# Patient Record
Sex: Female | Born: 1955 | Race: Black or African American | Hispanic: No | Smoking: Current every day smoker
Health system: Southern US, Community
[De-identification: ages and names within clinical notes are randomized; demographics above are authoritative.]

## PROBLEM LIST (undated history)

## (undated) DIAGNOSIS — R3129 Other microscopic hematuria: Secondary | ICD-10-CM

## (undated) DIAGNOSIS — K219 Gastro-esophageal reflux disease without esophagitis: Secondary | ICD-10-CM

## (undated) DIAGNOSIS — N809 Endometriosis, unspecified: Secondary | ICD-10-CM

## (undated) DIAGNOSIS — N951 Menopausal and female climacteric states: Secondary | ICD-10-CM

## (undated) DIAGNOSIS — I10 Essential (primary) hypertension: Secondary | ICD-10-CM

## (undated) DIAGNOSIS — R7989 Other specified abnormal findings of blood chemistry: Secondary | ICD-10-CM

## (undated) HISTORY — PX: ABDOMINAL HYSTERECTOMY: SHX81

## (undated) HISTORY — PX: TUBAL LIGATION: SHX77

## (undated) HISTORY — DX: Essential (primary) hypertension: I10

## (undated) HISTORY — DX: Gastro-esophageal reflux disease without esophagitis: K21.9

## (undated) HISTORY — PX: CHOLECYSTECTOMY: SHX55

## (undated) HISTORY — PX: OOPHORECTOMY: SHX86

---

## 2005-05-22 ENCOUNTER — Ambulatory Visit: Payer: Self-pay | Admitting: Internal Medicine

## 2005-10-18 ENCOUNTER — Ambulatory Visit: Payer: Self-pay | Admitting: Unknown Physician Specialty

## 2007-06-11 ENCOUNTER — Other Ambulatory Visit: Payer: Self-pay

## 2007-06-11 ENCOUNTER — Emergency Department: Payer: Self-pay | Admitting: Emergency Medicine

## 2007-06-17 ENCOUNTER — Ambulatory Visit: Payer: Self-pay | Admitting: General Surgery

## 2007-08-15 ENCOUNTER — Ambulatory Visit: Payer: Self-pay | Admitting: Internal Medicine

## 2008-05-18 ENCOUNTER — Ambulatory Visit: Payer: Self-pay | Admitting: Internal Medicine

## 2008-08-13 ENCOUNTER — Ambulatory Visit: Payer: Self-pay | Admitting: Urology

## 2008-08-13 ENCOUNTER — Ambulatory Visit: Payer: Self-pay | Admitting: Unknown Physician Specialty

## 2008-08-26 ENCOUNTER — Ambulatory Visit: Payer: Self-pay | Admitting: Unknown Physician Specialty

## 2009-12-24 ENCOUNTER — Ambulatory Visit: Payer: Self-pay | Admitting: Internal Medicine

## 2011-03-01 ENCOUNTER — Ambulatory Visit: Payer: Self-pay | Admitting: Internal Medicine

## 2011-12-16 ENCOUNTER — Emergency Department: Payer: Self-pay | Admitting: Emergency Medicine

## 2011-12-16 LAB — URINALYSIS, COMPLETE
Bilirubin,UR: NEGATIVE
Glucose,UR: NEGATIVE mg/dL (ref 0–75)
Ph: 6 (ref 4.5–8.0)
Protein: NEGATIVE
Specific Gravity: 1.012 (ref 1.003–1.030)
WBC UR: 3 /HPF (ref 0–5)

## 2011-12-16 LAB — CBC
HCT: 42.3 % (ref 35.0–47.0)
HGB: 14.1 g/dL (ref 12.0–16.0)
MCV: 96 fL (ref 80–100)
Platelet: 256 10*3/uL (ref 150–440)
RBC: 4.39 10*6/uL (ref 3.80–5.20)
RDW: 13.7 % (ref 11.5–14.5)
WBC: 10.2 10*3/uL (ref 3.6–11.0)

## 2011-12-16 LAB — COMPREHENSIVE METABOLIC PANEL
Anion Gap: 9 (ref 7–16)
Chloride: 103 mmol/L (ref 98–107)
Co2: 30 mmol/L (ref 21–32)
EGFR (African American): 59 — ABNORMAL LOW
EGFR (Non-African Amer.): 49 — ABNORMAL LOW
Osmolality: 286 (ref 275–301)
SGOT(AST): 25 U/L (ref 15–37)
SGPT (ALT): 21 U/L
Total Protein: 7.8 g/dL (ref 6.4–8.2)

## 2011-12-28 ENCOUNTER — Ambulatory Visit: Payer: Self-pay | Admitting: Internal Medicine

## 2012-03-05 ENCOUNTER — Ambulatory Visit: Payer: Self-pay | Admitting: Internal Medicine

## 2013-05-29 ENCOUNTER — Ambulatory Visit: Payer: Self-pay | Admitting: Internal Medicine

## 2014-06-01 ENCOUNTER — Ambulatory Visit: Payer: Self-pay | Admitting: Internal Medicine

## 2015-02-24 DIAGNOSIS — R7989 Other specified abnormal findings of blood chemistry: Secondary | ICD-10-CM | POA: Insufficient documentation

## 2015-05-11 ENCOUNTER — Other Ambulatory Visit: Payer: Self-pay | Admitting: Internal Medicine

## 2015-05-11 DIAGNOSIS — Z1231 Encounter for screening mammogram for malignant neoplasm of breast: Secondary | ICD-10-CM

## 2015-06-03 ENCOUNTER — Ambulatory Visit
Admission: RE | Admit: 2015-06-03 | Discharge: 2015-06-03 | Disposition: A | Discharge status: Home / Self Care | Payer: Federal, State, Local not specified - PPO | Source: Ambulatory Visit | Attending: Internal Medicine | Admitting: Internal Medicine

## 2015-06-03 DIAGNOSIS — Z1231 Encounter for screening mammogram for malignant neoplasm of breast: Secondary | ICD-10-CM | POA: Diagnosis present

## 2017-03-02 ENCOUNTER — Ambulatory Visit (INDEPENDENT_AMBULATORY_CARE_PROVIDER_SITE_OTHER): Payer: Federal, State, Local not specified - PPO | Admitting: Urology

## 2017-03-02 ENCOUNTER — Encounter: Payer: Self-pay | Admitting: Urology

## 2017-03-02 VITALS — BP 127/84 | HR 81 | Ht 67.0 in | Wt 196.2 lb

## 2017-03-02 DIAGNOSIS — R3121 Asymptomatic microscopic hematuria: Secondary | ICD-10-CM

## 2017-03-02 LAB — URINALYSIS, COMPLETE
BILIRUBIN UA: NEGATIVE
Glucose, UA: NEGATIVE
KETONES UA: NEGATIVE
LEUKOCYTES UA: NEGATIVE
NITRITE UA: NEGATIVE
SPEC GRAV UA: 1.02 (ref 1.005–1.030)
Urobilinogen, Ur: 0.2 mg/dL (ref 0.2–1.0)
pH, UA: 5.5 (ref 5.0–7.5)

## 2017-03-02 LAB — MICROSCOPIC EXAMINATION

## 2017-03-02 NOTE — Progress Notes (Signed)
03/02/2017 2:49 PM   Rebecca Richardson 1956-01-01 [id number]  Referring provider: Tracie Harrier, MD [address] [facility] [place], [place] [address]  Chief Complaint  Patient presents with  . Hematuria    HPI: The patient is a 61 year old female who presents today for evaluation of microscopic hematuria. The patient notes no history of gross hematuria. She has been told that she has microscopic hematuria on multiple occasions. She does again today in our office with 3-10 red blood cells per high-power field. She gets urinary traction's once or twice per year. She has no UTI symptoms currently. She voids well. She denies incontinence. She has no personal history of nephrolithiasis.  The patient has a 42 year smoking history though she is down to 2 cigarettes per day now.     PMH: Past Medical History:  Diagnosis Date  . GERD (gastroesophageal reflux disease)   . Hypertension     Surgical History: Past Surgical History:  Procedure Laterality Date  . ABDOMINAL HYSTERECTOMY    . CESAREAN SECTION    . CHOLECYSTECTOMY      Home Medications:  Allergies as of 03/02/2017      Reactions   Sulfa Antibiotics Other (See Comments)      Medication List       Accurate as of 03/02/17  2:49 PM. Always use your most recent med list.          meloxicam 15 MG tablet Commonly known as:  MOBIC   triamterene-hydrochlorothiazide 37.5-25 MG capsule Commonly known as:  DYAZIDE   Vitamin D (Ergocalciferol) 50000 units Caps capsule Commonly known as:  DRISDOL       Allergies:  Allergies  Allergen Reactions  . Sulfa Antibiotics Other (See Comments)    Family History: Family History  Problem Relation Age of Onset  . Breast cancer Maternal Aunt 60  . Breast cancer Paternal Grandmother 54  . Prostate cancer Maternal Uncle   . Bladder Cancer Neg Hx   . Kidney cancer Neg Hx     Social History:  reports that she has been smoking.  She has been smoking about  0.25 packs per day. She has never used smokeless tobacco. She reports that she does not drink alcohol or use drugs.  ROS: UROLOGY Frequent Urination?: No Hard to postpone urination?: No Burning/pain with urination?: No Get up at night to urinate?: Yes Leakage of urine?: No Urine stream starts and stops?: No Trouble starting stream?: No Do you have to strain to urinate?: No Blood in urine?: No Urinary tract infection?: Yes Sexually transmitted disease?: No Injury to kidneys or bladder?: No Painful intercourse?: No Weak stream?: No Currently pregnant?: No Vaginal bleeding?: No Last menstrual period?: n  Gastrointestinal Nausea?: No Vomiting?: No Indigestion/heartburn?: Yes Diarrhea?: No Constipation?: No  Constitutional Fever: No Night sweats?: No Weight loss?: No Fatigue?: No  Skin Skin rash/lesions?: No Itching?: Yes  Eyes Blurred vision?: Yes Double vision?: No  Ears/Nose/Throat Sore throat?: No Sinus problems?: No  Hematologic/Lymphatic Swollen glands?: No Easy bruising?: No  Cardiovascular Leg swelling?: No Chest pain?: No  Respiratory Cough?: No Shortness of breath?: No  Endocrine Excessive thirst?: No  Musculoskeletal Back pain?: No Joint pain?: No  Neurological Headaches?: Yes Dizziness?: No  Psychologic Depression?: No Anxiety?: No  Physical Exam: BP 127/84 (BP Location: Left Arm, Patient Position: Sitting, Cuff Size: Normal)   Pulse 81   Ht 5\' 7"  (1.702 m)   Wt 196 lb 3.2 oz (89 kg)   BMI 30.73  kg/m   Constitutional:  Alert and oriented, No acute distress. HEENT: [place] AT, moist mucus membranes.  Trachea midline, no masses. Cardiovascular: No clubbing, cyanosis, or edema. Respiratory: Normal respiratory effort, no increased work of breathing. GI: Abdomen is soft, nontender, nondistended, no abdominal masses GU: No CVA tenderness.  Skin: No rashes, bruises or suspicious lesions. Lymph: No cervical or inguinal  adenopathy. Neurologic: Grossly intact, no focal deficits, moving all 4 extremities. Psychiatric: Normal mood and affect.  Laboratory Data: Lab Results  Component Value Date   WBC 10.2 12/16/2011   HGB 14.1 12/16/2011   HCT 42.3 12/16/2011   MCV 96 12/16/2011   PLT 256 12/16/2011    Lab Results  Component Value Date   CREATININE 1.21 12/16/2011    No results found for: PSA  No results found for: TESTOSTERONE  No results found for: HGBA1C  Urinalysis    Component Value Date/Time   COLORURINE Yellow 12/16/2011 0517   APPEARANCEUR Cloudy 12/16/2011 0517   LABSPEC 1.012 12/16/2011 0517   PHURINE 6.0 12/16/2011 0517   GLUCOSEU Negative 12/16/2011 0517   HGBUR 1+ 12/16/2011 0517   BILIRUBINUR Negative 12/16/2011 0517   KETONESUR Negative 12/16/2011 0517   PROTEINUR Negative 12/16/2011 0517   NITRITE Positive 12/16/2011 0517   LEUKOCYTESUR Negative 12/16/2011 0517     Assessment & Plan:    1. Microscopic hematuria -CT Urogram -follow up for office cystoscopy after above  Return for after CT for cystoscopy.  Nickie Retort, MD  [facility] [address], [place], [place] [address] [phone]

## 2017-03-02 NOTE — Progress Notes (Signed)
Hematuria

## 2017-03-05 ENCOUNTER — Other Ambulatory Visit: Payer: Self-pay | Admitting: Internal Medicine

## 2017-03-05 DIAGNOSIS — Z1231 Encounter for screening mammogram for malignant neoplasm of breast: Secondary | ICD-10-CM

## 2017-03-05 DIAGNOSIS — Z1239 Encounter for other screening for malignant neoplasm of breast: Secondary | ICD-10-CM

## 2017-03-22 ENCOUNTER — Ambulatory Visit
Admission: RE | Admit: 2017-03-22 | Discharge: 2017-03-22 | Disposition: A | Discharge status: Home / Self Care | Payer: Federal, State, Local not specified - PPO | Source: Ambulatory Visit | Attending: Urology | Admitting: Urology

## 2017-03-22 DIAGNOSIS — R3121 Asymptomatic microscopic hematuria: Secondary | ICD-10-CM

## 2017-03-22 DIAGNOSIS — I700 Atherosclerosis of aorta: Secondary | ICD-10-CM | POA: Insufficient documentation

## 2017-03-22 MED ORDER — IOPAMIDOL (ISOVUE-300) INJECTION 61%
125.0000 mL | Freq: Once | INTRAVENOUS | Status: AC | PRN
  Administered 2017-03-22: 125 mL via INTRAVENOUS

## 2017-03-29 ENCOUNTER — Ambulatory Visit: Payer: Federal, State, Local not specified - PPO | Admitting: Urology

## 2017-03-29 ENCOUNTER — Encounter: Payer: Self-pay | Admitting: Urology

## 2017-03-29 VITALS — BP 131/84 | HR 79 | Ht 67.0 in | Wt 196.4 lb

## 2017-03-29 DIAGNOSIS — R3121 Asymptomatic microscopic hematuria: Secondary | ICD-10-CM

## 2017-03-29 LAB — URINALYSIS, COMPLETE
Bilirubin, UA: NEGATIVE
Glucose, UA: NEGATIVE
Ketones, UA: NEGATIVE
LEUKOCYTES UA: NEGATIVE
Nitrite, UA: NEGATIVE
PH UA: 5.5 (ref 5.0–7.5)
PROTEIN UA: NEGATIVE
Specific Gravity, UA: 1.015 (ref 1.005–1.030)
Urobilinogen, Ur: 0.2 mg/dL (ref 0.2–1.0)

## 2017-03-29 LAB — MICROSCOPIC EXAMINATION
Bacteria, UA: NONE SEEN
WBC, UA: NONE SEEN /HPF

## 2017-03-29 MED ORDER — LIDOCAINE HCL 2 % EX GEL
1.0000 "application " | Freq: Once | CUTANEOUS | Status: AC
  Administered 2017-03-29: 1 via URETHRAL

## 2017-03-29 MED ORDER — CIPROFLOXACIN HCL 500 MG PO TABS
500.0000 mg | ORAL_TABLET | Freq: Once | ORAL | Status: AC
  Administered 2017-03-29: 500 mg via ORAL

## 2017-03-29 NOTE — Progress Notes (Signed)
   03/29/17  CC:  Chief Complaint  Patient presents with  . Cysto    HPI: The patient is a 61 year old female who presents today for evaluation of microscopic hematuria. The patient notes no history of gross hematuria. She has been told that she has microscopic hematuria on multiple occasions. She does again today in our office with 3-10 red blood cells per high-power field. She gets urinary traction's once or twice per year. She has no UTI symptoms currently. She voids well. She denies incontinence. She has no personal history of nephrolithiasis.  The patient has a 42 year smoking history though she is down to 2 cigarettes per day now.  CT Hematuria was negative for source of hematuria. Due for cystoscopy today.  Blood pressure 131/84, pulse 79, height 5\' 7"  (1.702 m), weight 196 lb 6.4 oz (89.1 kg). NED. A&Ox3.   No respiratory distress   Abd soft, NT, ND Normal external genitalia with patent urethral meatus  Cystoscopy Procedure Note  Patient identification was confirmed, informed consent was obtained, and patient was prepped using Betadine solution.  Lidocaine jelly was administered per urethral meatus.    Preoperative abx where received prior to procedure.    Procedure: - Flexible cystoscope introduced, without any difficulty.   - Thorough search of the bladder revealed:    normal urethral meatus    normal urothelium    no stones    no ulcers     no tumors    no urethral polyps    no trabeculation  - Ureteral orifices were normal in position and appearance.  Post-Procedure: - Patient tolerated the procedure well  Assessment/ Plan:  1. Asymptomatic microscopic hematuria -Negative hematuria workup. Follow-up in one year for repeat urinalysis.  Nickie Retort, MD

## 2017-12-16 IMAGING — CT CT ABD-PEL WO/W CM
2 of 6 series · 13 of 32 positions shown, 18 images · IV contrast (APPLIED)
Comparison: 12/16/2011

CLINICAL DATA: Microscopic hematuria for 2 years. Frequent urinary
tract infections. Hysterectomy.

EXAM:
CT ABDOMEN AND PELVIS WITHOUT AND WITH CONTRAST
TECHNIQUE: Multidetector CT imaging of the abdomen and pelvis was performed
following the standard protocol before and following the bolus
administration of intravenous contrast.
CONTRAST:  125mL KQ5W6L-I22 IOPAMIDOL ([id number]) INJECTION 61%

[Series 2: axial pre · axial · non-contrast · 0.69mm/px · z∈[-890,-530]mm · 6 of 102 slices shown]
[im 15/102  soft-tissue]
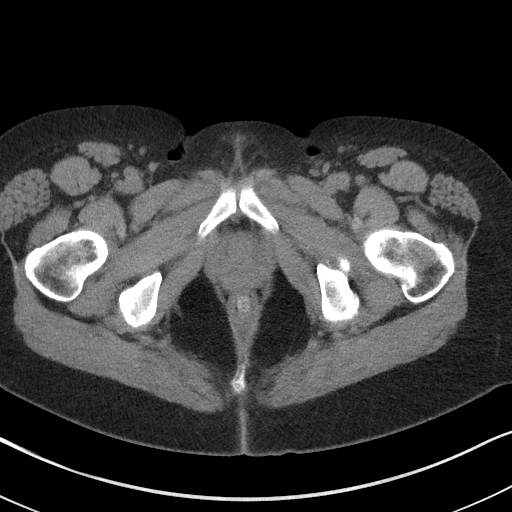
[im 29/102  soft-tissue]
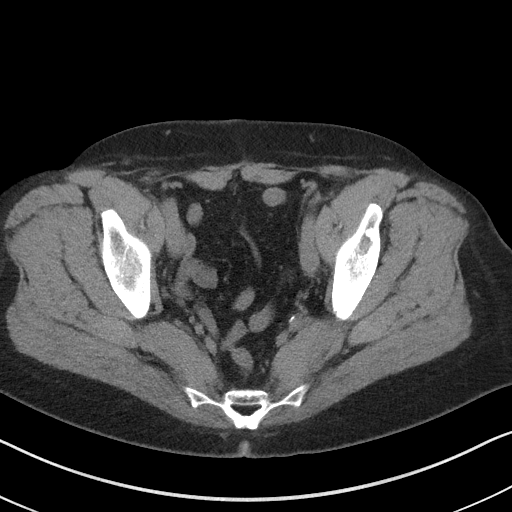
[im 44/102  soft-tissue]
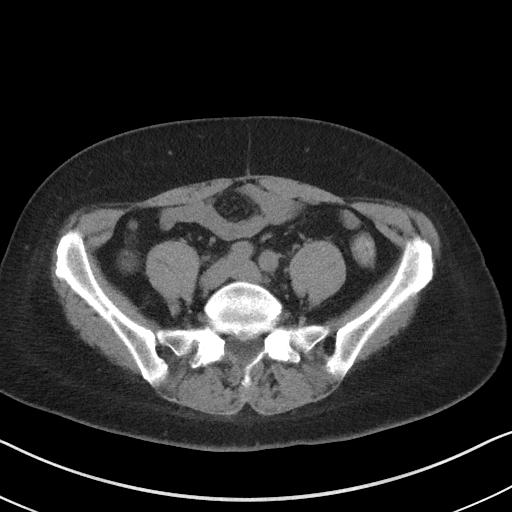
[im 58/102  soft-tissue]
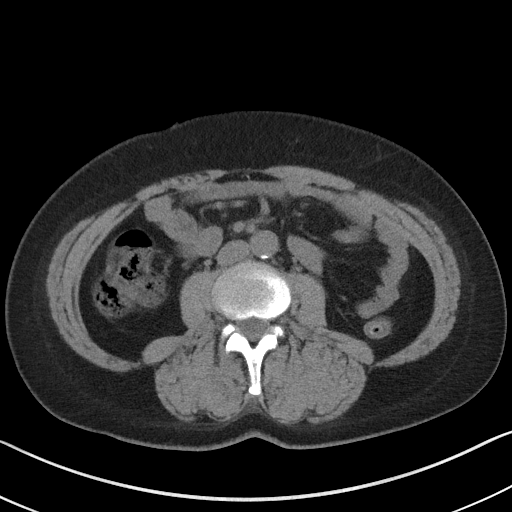
[im 73/102  soft-tissue]
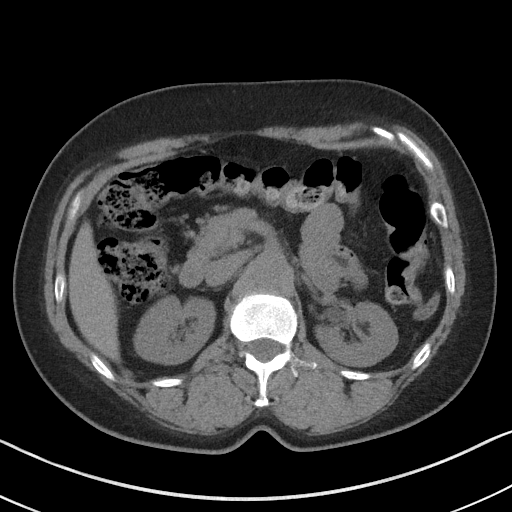
[im 87/102  soft-tissue]
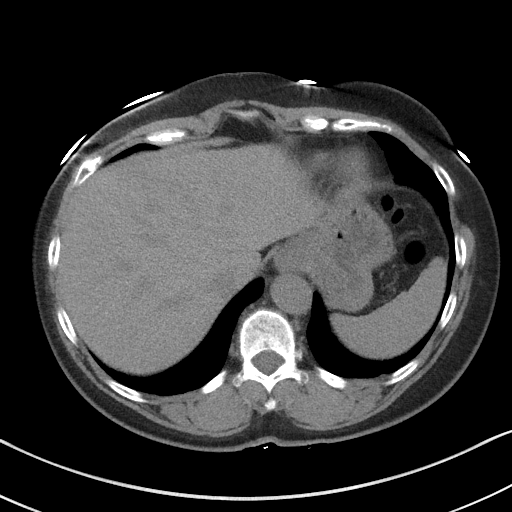

[Series 13: axial delay · axial · delayed · 0.69mm/px · z∈[-1025,-635]mm · 7 of 105 slices shown, 12 images]
[im 14/105  soft-tissue]
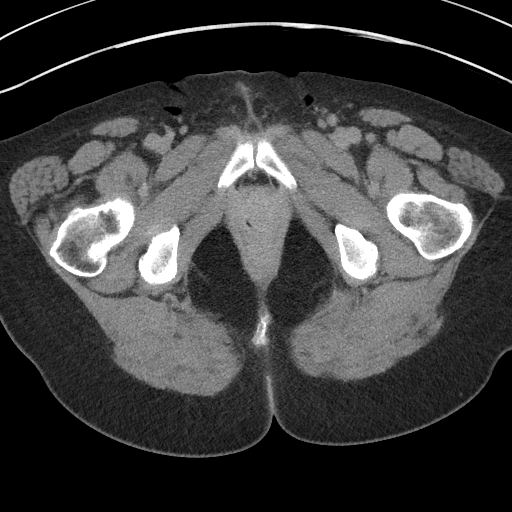
[im 14/105  bone]
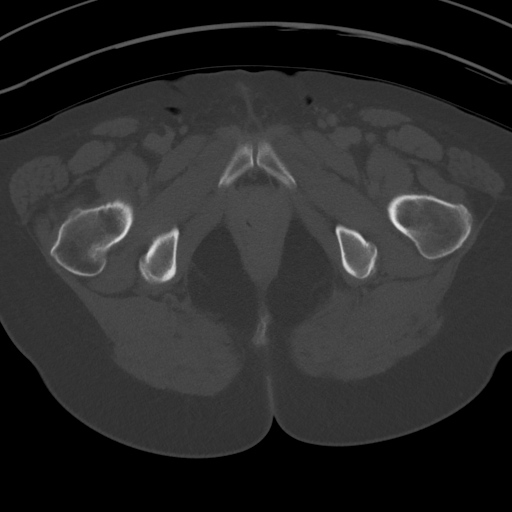
[im 27/105  soft-tissue]
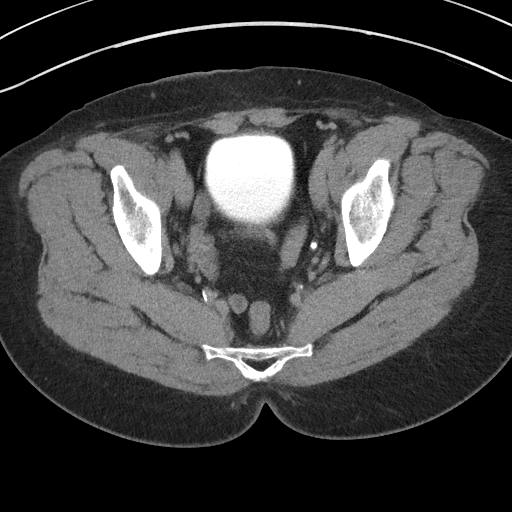
[im 40/105  soft-tissue]
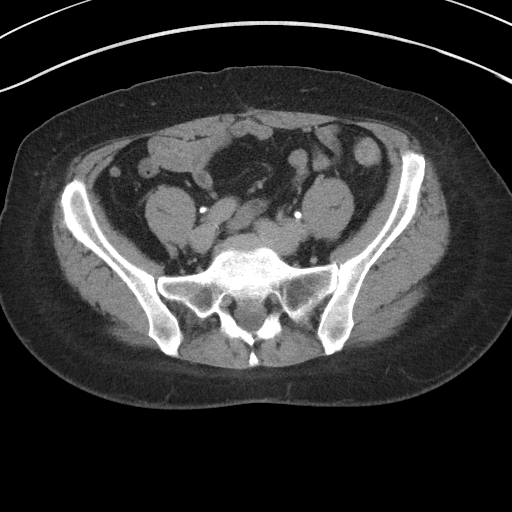
[im 53/105  soft-tissue]
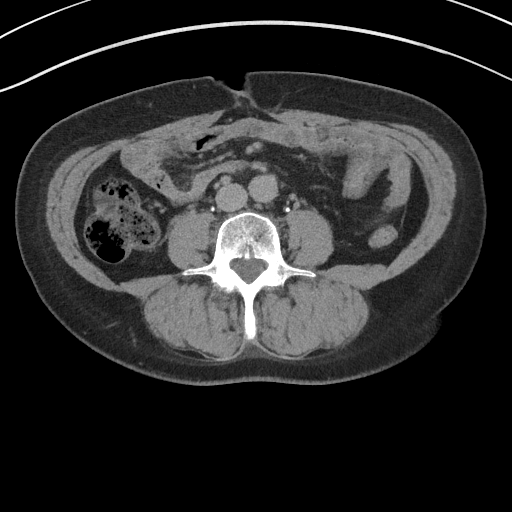
[im 53/105  lung]
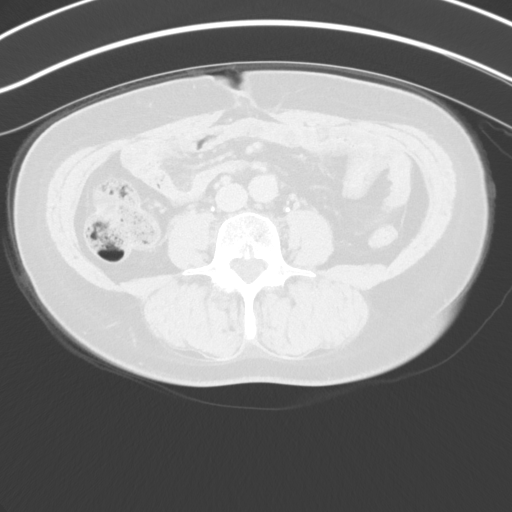
[im 66/105  soft-tissue]
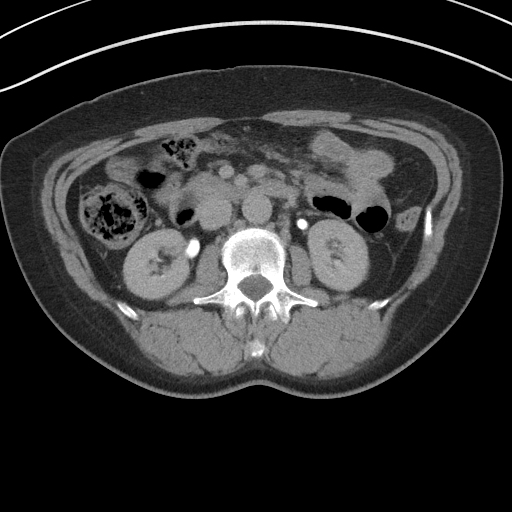
[im 66/105  lung]
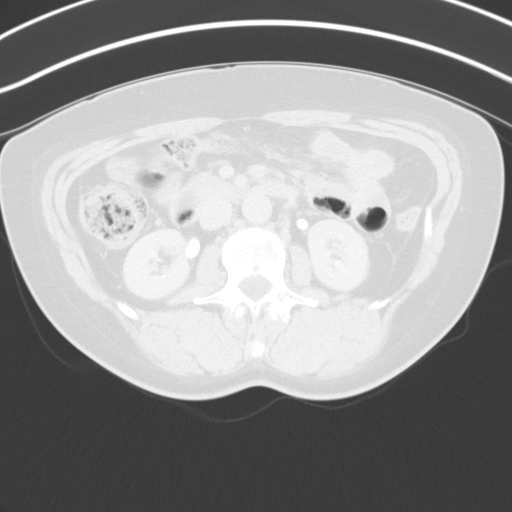
[im 79/105  soft-tissue]
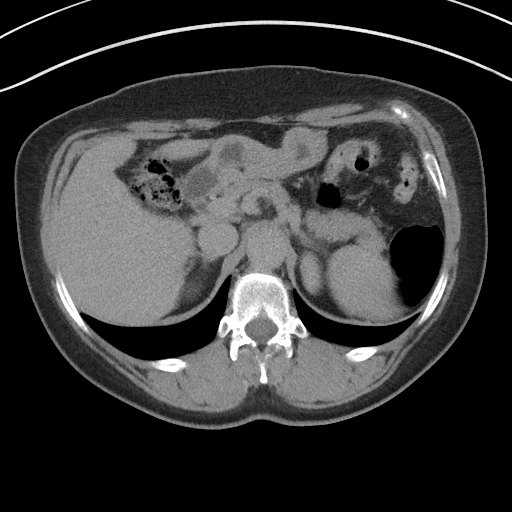
[im 79/105  lung]
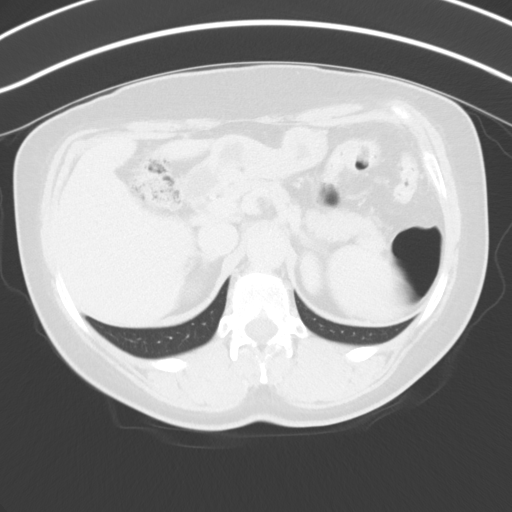
[im 92/105  soft-tissue]
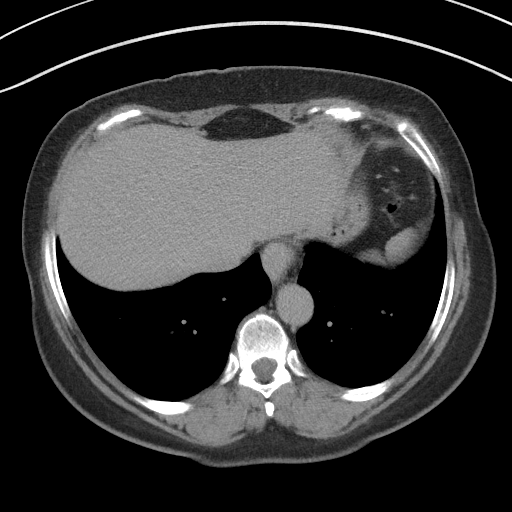
[im 92/105  lung]
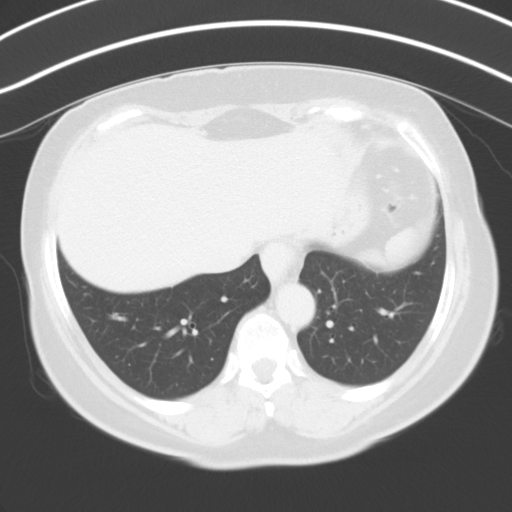

[13 of 32 positions shown; findings below may reference images not displayed]

FINDINGS: Lower chest: A subpleural 3 mm right lower lobe pulmonary nodule on
image 2/series 8 is similar to the 0725 exam and can be presumed
benign. Tiny left lower lobe nodules x2 are also unchanged and
therefore benign. Normal heart size without pericardial or pleural
effusion. Tiny hiatal hernia.

Hepatobiliary: Normal liver. Cholecystectomy, without biliary ductal
dilatation.

Pancreas: Normal, without mass or ductal dilatation.

Spleen: Normal in size, without focal abnormality.

Adrenals/Urinary Tract: Normal adrenal glands. No renal calculi or
hydronephrosis. No hydroureter or ureteric calculi. No bladder
calculi. No renal mass on post-contrast images. Moderate renal
collecting system opacification on delayed images. Good ureteric
opacification, other than the distal right ureter. No ureteric
filling defects identified.

No enhancing bladder mass or filling defect on delayed images.

Stomach/Bowel: Normal remainder of the stomach. Normal colon,
appendix, and terminal ileum. Normal small bowel.

Vascular/Lymphatic: Aortic and branch vessel atherosclerosis. No
abdominopelvic adenopathy.

Reproductive: Hysterectomy.  No adnexal mass.

Other: No significant free fluid.

Musculoskeletal: Well-circumscribed, peripherally sclerotic proximal
right femoral lesion is unchanged at 1.3 cm, benign. There is also a
right proximal femoral tiny bone island.
IMPRESSION: 1.  No acute process or explanation for hematuria.
2.  Aortic atherosclerosis.

## 2018-03-28 ENCOUNTER — Ambulatory Visit: Payer: Federal, State, Local not specified - PPO

## 2018-03-29 ENCOUNTER — Ambulatory Visit
Admission: RE | Admit: 2018-03-29 | Discharge: 2018-03-29 | Disposition: A | Discharge status: Home / Self Care | Payer: Federal, State, Local not specified - PPO | Source: Ambulatory Visit | Attending: Internal Medicine | Admitting: Internal Medicine

## 2018-03-29 DIAGNOSIS — Z1231 Encounter for screening mammogram for malignant neoplasm of breast: Secondary | ICD-10-CM | POA: Diagnosis not present

## 2018-03-29 DIAGNOSIS — Z1239 Encounter for other screening for malignant neoplasm of breast: Secondary | ICD-10-CM

## 2018-04-25 ENCOUNTER — Ambulatory Visit: Payer: Federal, State, Local not specified - PPO

## 2018-05-22 DIAGNOSIS — I10 Essential (primary) hypertension: Secondary | ICD-10-CM | POA: Insufficient documentation

## 2018-05-22 DIAGNOSIS — F17200 Nicotine dependence, unspecified, uncomplicated: Secondary | ICD-10-CM | POA: Insufficient documentation

## 2018-05-22 DIAGNOSIS — K219 Gastro-esophageal reflux disease without esophagitis: Secondary | ICD-10-CM | POA: Insufficient documentation

## 2018-05-22 NOTE — Progress Notes (Deleted)
05/23/2018 8:30 PM   [place] 02-Mar-1956 [id number]  Referring provider: Tracie Harrier, MD [address] [facility] [place], [place] [address]  No chief complaint on file.   HPI: Patient is a 62 year old African-American female with a history of hematuria who presents today for follow-up.  CTU in 03/2017 noted no acute process or explanation for hematuria.  Aortic atherosclerosis  Cystoscopy with Dr. Pilar Jarvis in 03/2017 was negative.    The patient has a 42 year smoking history though she is down to 2 cigarettes per day now.    Today, ***.   Her UA is ***.      PMH: Past Medical History:  Diagnosis Date  . GERD (gastroesophageal reflux disease)   . Hypertension     Surgical History: Past Surgical History:  Procedure Laterality Date  . ABDOMINAL HYSTERECTOMY    . CESAREAN SECTION    . CHOLECYSTECTOMY    . OOPHORECTOMY      Home Medications:  Allergies as of 05/23/2018      Reactions   Sulfa Antibiotics Other (See Comments)      Medication List        Accurate as of 05/22/18  8:30 PM. Always use your most recent med list.          meloxicam 15 MG tablet Commonly known as:  MOBIC   triamterene-hydrochlorothiazide 37.5-25 MG capsule Commonly known as:  DYAZIDE   Vitamin D (Ergocalciferol) 50000 units Caps capsule Commonly known as:  DRISDOL       Allergies:  Allergies  Allergen Reactions  . Sulfa Antibiotics Other (See Comments)    Family History: Family History  Problem Relation Age of Onset  . Breast cancer Maternal Aunt 60  . Breast cancer Paternal Grandmother 84  . Prostate cancer Maternal Uncle   . Bladder Cancer Neg Hx   . Kidney cancer Neg Hx     Social History:  reports that she has been smoking.  She has been smoking about 0.25 packs per day. She has never used smokeless tobacco. She reports that she does not drink alcohol or use drugs.  ROS:                                         Physical Exam: There were no vitals taken for this visit.  Constitutional: Well nourished. Alert and oriented, No acute distress. HEENT: [place] AT, moist mucus membranes. Trachea midline, no masses. Cardiovascular: No clubbing, cyanosis, or edema. Respiratory: Normal respiratory effort, no increased work of breathing. Skin: No rashes, bruises or suspicious lesions. Lymph: No cervical or inguinal adenopathy. Neurologic: Grossly intact, no focal deficits, moving all 4 extremities. Psychiatric: Normal mood and affect.   Laboratory Data: Lab Results  Component Value Date   WBC 10.2 12/16/2011   HGB 14.1 12/16/2011   HCT 42.3 12/16/2011   MCV 96 12/16/2011   PLT 256 12/16/2011    Lab Results  Component Value Date   CREATININE 1.21 12/16/2011    No results found for: PSA  No results found for: TESTOSTERONE  No results found for: HGBA1C  Urinalysis ***  I have reviewed the labs.   Assessment & Plan:    1. History of hematuria Hematuria work up negative in 03/2017 No reports of gross hematuria UA is *** RTC in one year for UA -patient to report any gross hematuria  No follow-ups on file.  Zara Council, PA-C  [facility] [address] [place], [place] [address] [phone]

## 2018-05-23 ENCOUNTER — Ambulatory Visit: Payer: Federal, State, Local not specified - PPO | Admitting: Urology

## 2019-03-12 ENCOUNTER — Other Ambulatory Visit: Payer: Self-pay | Admitting: Internal Medicine

## 2019-03-12 DIAGNOSIS — Z1231 Encounter for screening mammogram for malignant neoplasm of breast: Secondary | ICD-10-CM

## 2020-03-15 ENCOUNTER — Other Ambulatory Visit: Payer: Self-pay | Admitting: Internal Medicine

## 2020-03-15 DIAGNOSIS — Z1231 Encounter for screening mammogram for malignant neoplasm of breast: Secondary | ICD-10-CM

## 2020-03-24 ENCOUNTER — Ambulatory Visit
Admission: RE | Admit: 2020-03-24 | Discharge: 2020-03-24 | Disposition: A | Discharge status: Home / Self Care | Payer: Medicare Other | Source: Ambulatory Visit | Attending: Internal Medicine | Admitting: Internal Medicine

## 2020-03-24 DIAGNOSIS — Z1231 Encounter for screening mammogram for malignant neoplasm of breast: Secondary | ICD-10-CM | POA: Diagnosis not present

## 2021-03-17 ENCOUNTER — Other Ambulatory Visit: Payer: Self-pay | Admitting: Internal Medicine

## 2021-03-17 DIAGNOSIS — Z1231 Encounter for screening mammogram for malignant neoplasm of breast: Secondary | ICD-10-CM

## 2021-07-21 ENCOUNTER — Encounter: Payer: Self-pay | Admitting: *Deleted

## 2021-07-22 ENCOUNTER — Ambulatory Visit: Payer: Medicare Other | Admitting: Certified Registered Nurse Anesthetist

## 2021-07-22 ENCOUNTER — Encounter: Payer: Self-pay | Admitting: Gastroenterology

## 2021-07-22 ENCOUNTER — Ambulatory Visit
Admission: RE | Admit: 2021-07-22 | Discharge: 2021-07-22 | Disposition: A | Discharge status: Home / Self Care | Payer: Medicare Other | Attending: Gastroenterology | Admitting: Gastroenterology

## 2021-07-22 ENCOUNTER — Encounter
Admission: RE | Disposition: A | Discharge status: Home / Self Care | Payer: Self-pay | Source: Home / Self Care | Attending: Gastroenterology

## 2021-07-22 DIAGNOSIS — Z1211 Encounter for screening for malignant neoplasm of colon: Secondary | ICD-10-CM | POA: Insufficient documentation

## 2021-07-22 DIAGNOSIS — D123 Benign neoplasm of transverse colon: Secondary | ICD-10-CM | POA: Diagnosis not present

## 2021-07-22 DIAGNOSIS — Z791 Long term (current) use of non-steroidal anti-inflammatories (NSAID): Secondary | ICD-10-CM | POA: Insufficient documentation

## 2021-07-22 DIAGNOSIS — K5730 Diverticulosis of large intestine without perforation or abscess without bleeding: Secondary | ICD-10-CM | POA: Insufficient documentation

## 2021-07-22 DIAGNOSIS — I10 Essential (primary) hypertension: Secondary | ICD-10-CM | POA: Insufficient documentation

## 2021-07-22 DIAGNOSIS — Z8371 Family history of colonic polyps: Secondary | ICD-10-CM | POA: Insufficient documentation

## 2021-07-22 DIAGNOSIS — Z882 Allergy status to sulfonamides status: Secondary | ICD-10-CM | POA: Insufficient documentation

## 2021-07-22 DIAGNOSIS — Z79899 Other long term (current) drug therapy: Secondary | ICD-10-CM | POA: Diagnosis not present

## 2021-07-22 HISTORY — DX: Other specified abnormal findings of blood chemistry: R79.89

## 2021-07-22 HISTORY — PX: COLONOSCOPY WITH PROPOFOL: SHX5780

## 2021-07-22 HISTORY — DX: Other microscopic hematuria: R31.29

## 2021-07-22 HISTORY — DX: Endometriosis, unspecified: N80.9

## 2021-07-22 HISTORY — DX: Menopausal and female climacteric states: N95.1

## 2021-07-22 SURGERY — COLONOSCOPY WITH PROPOFOL
Anesthesia: General

## 2021-07-22 MED ORDER — LIDOCAINE HCL (CARDIAC) PF 100 MG/5ML IV SOSY
PREFILLED_SYRINGE | INTRAVENOUS | Status: DC | PRN
  Administered 2021-07-22: 100 mg via INTRAVENOUS

## 2021-07-22 MED ORDER — PROPOFOL 10 MG/ML IV BOLUS
INTRAVENOUS | Status: DC | PRN
  Administered 2021-07-22: 60 mg via INTRAVENOUS

## 2021-07-22 MED ORDER — SODIUM CHLORIDE 0.9 % IV SOLN
INTRAVENOUS | Status: DC
  Administered 2021-07-22: 20 mL/h via INTRAVENOUS

## 2021-07-22 MED ORDER — PROPOFOL 500 MG/50ML IV EMUL
INTRAVENOUS | Status: DC | PRN
  Administered 2021-07-22: 140 ug/kg/min via INTRAVENOUS

## 2021-07-22 NOTE — Anesthesia Postprocedure Evaluation (Signed)
Anesthesia Post Note  Patient: Randell Beehler  Procedure(s) Performed: COLONOSCOPY WITH PROPOFOL  Patient location during evaluation: Endoscopy Anesthesia Type: General Level of consciousness: awake and alert Pain management: pain level controlled Vital Signs Assessment: post-procedure vital signs reviewed and stable Respiratory status: spontaneous breathing, nonlabored ventilation, respiratory function stable and patient connected to nasal cannula oxygen Cardiovascular status: blood pressure returned to baseline and stable Postop Assessment: no apparent nausea or vomiting Anesthetic complications: no   No notable events documented.   Last Vitals:  Vitals:   07/22/21 1150 07/22/21 1238  BP: 125/86 121/77  Pulse: 62 64  Resp: 20 20  Temp: (!) 36.2 C 36.8 C  SpO2: 100% 97%    Last Pain:  Vitals:   07/22/21 1238  TempSrc: Temporal  PainSc: Asleep                 Margaree Mackintosh

## 2021-07-22 NOTE — H&P (Signed)
Outpatient short stay form Pre-procedure 07/22/2021  Lesly Rubenstein, MD  Primary Physician: Tracie Harrier, MD  Reason for visit:  Screening  History of present illness:   65 y/o lady with history of hypertension and family history of polyps in her mother of unknown type with last colonoscopy 5 years ago normal. No blood thinners. History of hysterectomy. No family history of GI malignancies.    Current Facility-Administered Medications:    0.9 %  sodium chloride infusion, , Intravenous, Continuous, Killian Ress, Hilton Cork, MD, Last Rate: 20 mL/hr at 07/22/21 1201, 20 mL/hr at 07/22/21 1201  Medications Prior to Admission  Medication Sig Dispense Refill Last Dose   meloxicam (MOBIC) 15 MG tablet    Past Week   omeprazole (PRILOSEC) 20 MG capsule Take 20 mg by mouth daily.   Past Week   triamterene-hydrochlorothiazide (DYAZIDE) 37.5-25 MG capsule    Past Week   Vitamin D, Ergocalciferol, (DRISDOL) 50000 units CAPS capsule    Past Week     Allergies  Allergen Reactions   Sulfa Antibiotics Other (See Comments)     Past Medical History:  Diagnosis Date   Endometriosis    GERD (gastroesophageal reflux disease)    Hypertension    Low vitamin D level    Menopausal and female climacteric states    Microscopic hematuria     Review of systems:  Otherwise negative.    Physical Exam  Gen: Alert, oriented. Appears stated age.  HEENT: PERRLA. Lungs: No respiratory distress CV: RRR Abd: soft, benign, no masses Ext: No edema    Planned procedures: Proceed with colonoscopy. The patient understands the nature of the planned procedure, indications, risks, alternatives and potential complications including but not limited to bleeding, infection, perforation, damage to internal organs and possible oversedation/side effects from anesthesia. The patient agrees and gives consent to proceed.  Please refer to procedure notes for findings, recommendations and patient  disposition/instructions.     Lesly Rubenstein, MD [facility]

## 2021-07-22 NOTE — Interval H&P Note (Signed)
History and Physical Interval Note:  07/22/2021 12:05 PM  Rebecca Richardson  has presented today for surgery, with the diagnosis of FAMILY HX.OF COLON POLYPS.  The various methods of treatment have been discussed with the patient and family. After consideration of risks, benefits and other options for treatment, the patient has consented to  Procedure(s): COLONOSCOPY WITH PROPOFOL (N/A) as a surgical intervention.  The patient's history has been reviewed, patient examined, no change in status, stable for surgery.  I have reviewed the patient's chart and labs.  Questions were answered to the patient's satisfaction.     Lesly Rubenstein  Ok to proceed with colonoscopy

## 2021-07-22 NOTE — Anesthesia Preprocedure Evaluation (Signed)
Anesthesia Evaluation  Patient identified by MRN, date of birth, ID band Patient awake    Reviewed: Allergy & Precautions, NPO status , Patient's Chart, lab work & pertinent test results  Airway Mallampati: III  TM Distance: >3 FB Neck ROM: full    Dental  (+) Upper Dentures, Lower Dentures   Pulmonary neg pulmonary ROS, Current Smoker and Patient abstained from smoking.,    Pulmonary exam normal        Cardiovascular hypertension, negative cardio ROS Normal cardiovascular exam     Neuro/Psych negative neurological ROS  negative psych ROS   GI/Hepatic Neg liver ROS, GERD  Medicated,  Endo/Other  negative endocrine ROS  Renal/GU negative Renal ROS  negative genitourinary   Musculoskeletal   Abdominal   Peds  Hematology negative hematology ROS (+)   Anesthesia Other Findings Past Medical History: No date: Endometriosis No date: GERD (gastroesophageal reflux disease) No date: Hypertension No date: Low vitamin D level No date: Menopausal and female climacteric states No date: Microscopic hematuria  Past Surgical History: No date: ABDOMINAL HYSTERECTOMY No date: CESAREAN SECTION No date: CHOLECYSTECTOMY No date: OOPHORECTOMY No date: TUBAL LIGATION  BMI    Body Mass Index: 29.60 kg/m      Reproductive/Obstetrics negative OB ROS                             Anesthesia Physical Anesthesia Plan  ASA: 2  Anesthesia Plan: General   Post-op Pain Management:    Induction: Intravenous  PONV Risk Score and Plan: Propofol infusion and TIVA  Airway Management Planned: Natural Airway and Nasal Cannula  Additional Equipment:   Intra-op Plan:   Post-operative Plan:   Informed Consent: I have reviewed the patients History and Physical, chart, labs and discussed the procedure including the risks, benefits and alternatives for the proposed anesthesia with the patient or authorized  representative who has indicated his/her understanding and acceptance.     Dental Advisory Given  Plan Discussed with: Anesthesiologist, CRNA and Surgeon  Anesthesia Plan Comments: (Patient consented for risks of anesthesia including but not limited to:  - adverse reactions to medications - risk of airway placement if required - damage to eyes, teeth, lips or other oral mucosa - nerve damage due to positioning  - sore throat or hoarseness - Damage to heart, brain, nerves, lungs, other parts of body or loss of life  Patient voiced understanding.)        Anesthesia Quick Evaluation

## 2021-07-22 NOTE — Transfer of Care (Signed)
Immediate Anesthesia Transfer of Care Note  Patient: Rebecca Richardson  Procedure(s) Performed: COLONOSCOPY WITH PROPOFOL  Patient Location: Endoscopy Unit  Anesthesia Type:General  Level of Consciousness: drowsy  Airway & Oxygen Therapy: Patient Spontanous Breathing  Post-op Assessment: Report given to RN and Post -op Vital signs reviewed and stable  Post vital signs: Reviewed and stable  Last Vitals:  Vitals Value Taken Time  BP 121/77   Temp    Pulse 64   Resp 16   SpO2 98     Last Pain:  Vitals:   07/22/21 1150  TempSrc: Temporal  PainSc: 0-No pain         Complications: No notable events documented.

## 2021-07-22 NOTE — Op Note (Signed)
[facility] Patient Name: Rebecca Richardson Procedure Date: 07/22/2021 12:09 PM MRN: [id number] Account #: [id number] Date of Birth: 06-07-1956 Admit Type: Outpatient Age: 65 Room: [facility] ENDO ROOM 3 Gender: Female Note Status: Finalized Instrument Name: Jasper Riling [id number] Procedure:             Colonoscopy Indications:           Colon cancer screening in patient at increased risk:                         Family history of 1st-degree relative with colon polyps Providers:             Andrey Farmer MD, MD Referring MD:          Tracie Harrier, MD (Referring MD) Medicines:             Monitored Anesthesia Care Complications:         No immediate complications. Estimated blood loss:                         Minimal. Procedure:             Pre-Anesthesia Assessment:                        - Prior to the procedure, a History and Physical was                         performed, and patient medications and allergies were                         reviewed. The patient is competent. The risks and                         benefits of the procedure and the sedation options and                         risks were discussed with the patient. All questions                         were answered and informed consent was obtained.                         Patient identification and proposed procedure were                         verified by the physician, the nurse, the anesthetist                         and the technician in the endoscopy suite. Mental                         Status Examination: alert and oriented. Airway                         Examination: normal oropharyngeal airway and neck                         mobility. Respiratory Examination: clear to  auscultation. CV Examination: normal. Prophylactic                         Antibiotics: The patient does not require prophylactic                         antibiotics. Prior  Anticoagulants: The patient has                         taken no previous anticoagulant or antiplatelet                         agents. ASA Grade Assessment: II - A patient with mild                         systemic disease. After reviewing the risks and                         benefits, the patient was deemed in satisfactory                         condition to undergo the procedure. The anesthesia                         plan was to use monitored anesthesia care (MAC).                         Immediately prior to administration of medications,                         the patient was re-assessed for adequacy to receive                         sedatives. The heart rate, respiratory rate, oxygen                         saturations, blood pressure, adequacy of pulmonary                         ventilation, and response to care were monitored                         throughout the procedure. The physical status of the                         patient was re-assessed after the procedure.                        After obtaining informed consent, the colonoscope was                         passed under direct vision. Throughout the procedure,                         the patient's blood pressure, pulse, and oxygen                         saturations were monitored continuously. The  Colonoscope was introduced through the anus and                         advanced to the the cecum, identified by appendiceal                         orifice and ileocecal valve. The colonoscopy was                         performed without difficulty. The patient tolerated                         the procedure well. The quality of the bowel                         preparation was adequate to identify polyps. Findings:      The perianal and digital rectal examinations were normal.      A 3 mm polyp was found in the ascending colon. The polyp was sessile.       The polyp was removed with a cold  snare. Resection and retrieval were       complete. Estimated blood loss was minimal.      A 2 mm polyp was found in the proximal transverse colon. The polyp was       sessile. The polyp was removed with a cold snare. Resection and       retrieval were complete. Estimated blood loss was minimal.      A single small-mouthed diverticulum was found in the hepatic flexure.      The exam was otherwise without abnormality on direct and retroflexion       views. Impression:            - One 3 mm polyp in the ascending colon, removed with                         a cold snare. Resected and retrieved.                        - One 2 mm polyp in the proximal transverse colon,                         removed with a cold snare. Resected and retrieved.                        - Diverticulosis at the hepatic flexure.                        - The examination was otherwise normal on direct and                         retroflexion views. Recommendation:        - Discharge patient to home.                        - Resume previous diet.                        - Continue present medications.                        -  Await pathology results.                        - Repeat colonoscopy for surveillance based on                         pathology results.                        - Return to referring physician as previously                         scheduled. Procedure Code(s):     --- Professional ---                        [phone], Colonoscopy, flexible; with removal of                         tumor(s), polyp(s), or other lesion(s) by snare                         technique Diagnosis Code(s):     --- Professional ---                        Z83.71, Family history of colonic polyps                        K63.5, Polyp of colon                        K57.30, Diverticulosis of large intestine without                         perforation or abscess without bleeding CPT copyright 2019 [employer]. All  rights reserved. The codes documented in this report are preliminary and upon coder review may  be revised to meet current compliance requirements. Andrey Farmer MD, MD 07/22/2021 12:38:48 PM Number of Addenda: 0 Note Initiated On: 07/22/2021 12:09 PM Scope Withdrawal Time: 0 hours 13 minutes 45 seconds  Total Procedure Duration: 0 hours 20 minutes 5 seconds  Estimated Blood Loss:  Estimated blood loss was minimal.      [facility]

## 2021-07-22 NOTE — Anesthesia Procedure Notes (Signed)
Date/Time: 07/22/2021 12:12 PM Performed by: Lily Peer, Darleen Moffitt, CRNA Pre-anesthesia Checklist: Patient identified, Emergency Drugs available, Suction available, Patient being monitored and Timeout performed Patient Re-evaluated:Patient Re-evaluated prior to induction Oxygen Delivery Method: Nasal cannula Induction Type: IV induction

## 2021-07-25 LAB — SURGICAL PATHOLOGY

## 2022-03-17 ENCOUNTER — Other Ambulatory Visit: Payer: Self-pay | Admitting: Internal Medicine

## 2022-03-17 DIAGNOSIS — Z1231 Encounter for screening mammogram for malignant neoplasm of breast: Secondary | ICD-10-CM

## 2022-04-18 ENCOUNTER — Ambulatory Visit
Admission: RE | Admit: 2022-04-18 | Discharge: 2022-04-18 | Disposition: A | Discharge status: Home / Self Care | Payer: Medicare Other | Source: Ambulatory Visit | Attending: Internal Medicine | Admitting: Internal Medicine

## 2022-04-18 DIAGNOSIS — Z1231 Encounter for screening mammogram for malignant neoplasm of breast: Secondary | ICD-10-CM | POA: Insufficient documentation

## 2023-05-31 ENCOUNTER — Other Ambulatory Visit: Payer: Self-pay | Admitting: Internal Medicine

## 2023-05-31 DIAGNOSIS — Z1231 Encounter for screening mammogram for malignant neoplasm of breast: Secondary | ICD-10-CM

## 2023-06-12 ENCOUNTER — Ambulatory Visit
Admission: RE | Admit: 2023-06-12 | Discharge: 2023-06-12 | Disposition: A | Discharge status: Home / Self Care | Payer: Medicare Other | Source: Ambulatory Visit | Attending: Internal Medicine | Admitting: Internal Medicine

## 2023-06-12 DIAGNOSIS — Z1231 Encounter for screening mammogram for malignant neoplasm of breast: Secondary | ICD-10-CM | POA: Insufficient documentation

## 2024-07-24 ENCOUNTER — Other Ambulatory Visit: Payer: Self-pay | Admitting: Internal Medicine

## 2024-07-24 DIAGNOSIS — Z1231 Encounter for screening mammogram for malignant neoplasm of breast: Secondary | ICD-10-CM

## 2024-07-25 ENCOUNTER — Ambulatory Visit
Admission: RE | Admit: 2024-07-25 | Discharge: 2024-07-25 | Disposition: A | Discharge status: Home / Self Care | Source: Ambulatory Visit | Attending: Internal Medicine | Admitting: Internal Medicine

## 2024-07-25 DIAGNOSIS — Z1231 Encounter for screening mammogram for malignant neoplasm of breast: Secondary | ICD-10-CM | POA: Insufficient documentation
